# Patient Record
Sex: Male | Born: 1960 | Race: White | Hispanic: No | Marital: Married | Smoking: Current every day smoker
Health system: Southern US, Community
[De-identification: ages and names within clinical notes are randomized; demographics above are authoritative.]

## PROBLEM LIST (undated history)

## (undated) DIAGNOSIS — Z789 Other specified health status: Secondary | ICD-10-CM

---

## 2004-05-16 ENCOUNTER — Emergency Department (HOSPITAL_COMMUNITY): Admission: EM | Admit: 2004-05-16 | Discharge: 2004-05-16 | Payer: Self-pay | Admitting: Emergency Medicine

## 2005-06-26 IMAGING — CR DG FOOT COMPLETE 3+V*L*
3 series · 3 of 3 positions shown · non-contrast
Comparison: none

CLINICAL DATA: Fall.  Left foot trauma.  Pain and swelling.  
 LEFT FOOT  - THREE VIEW:
 A minimally displaced fracture is seen involving the fifth metatarsal head.  Associated soft tissue swelling is seen.  No other fractures are seen and there is no evidence of dislocation.

[view not recorded (1 of 3)]
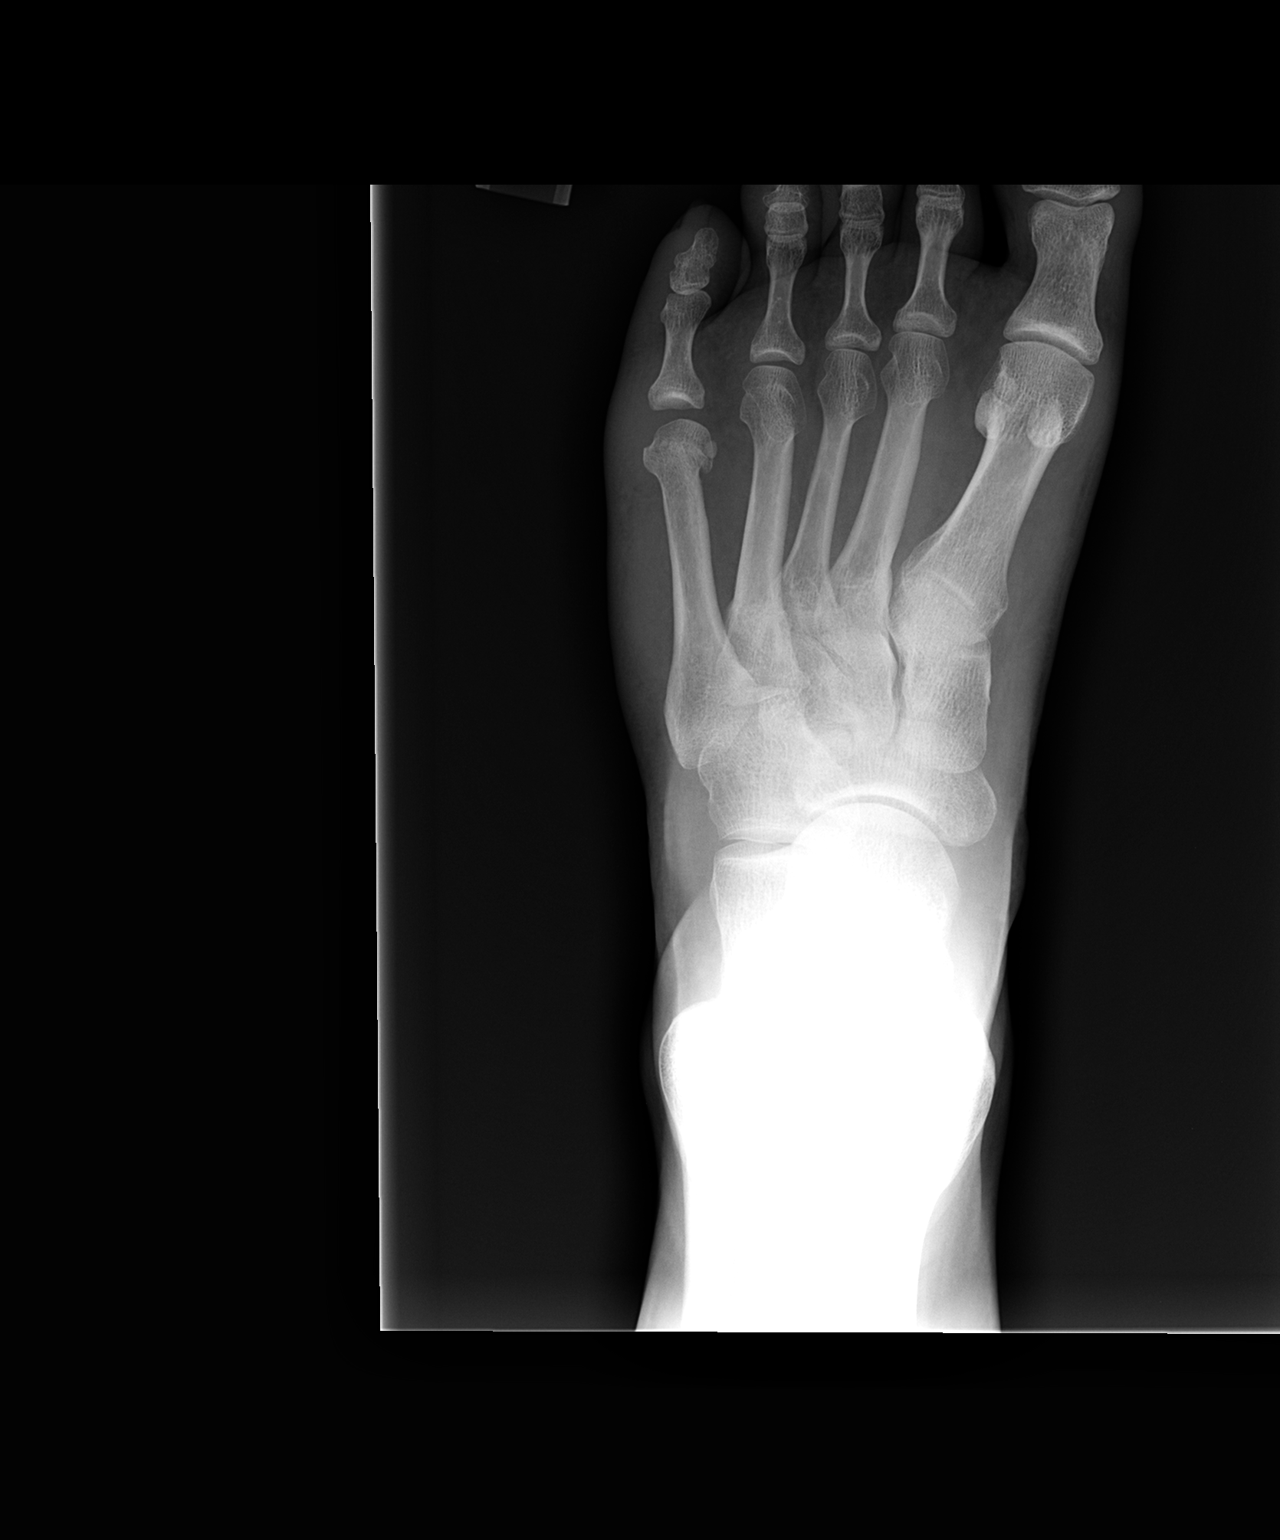

[view not recorded (2 of 3)]
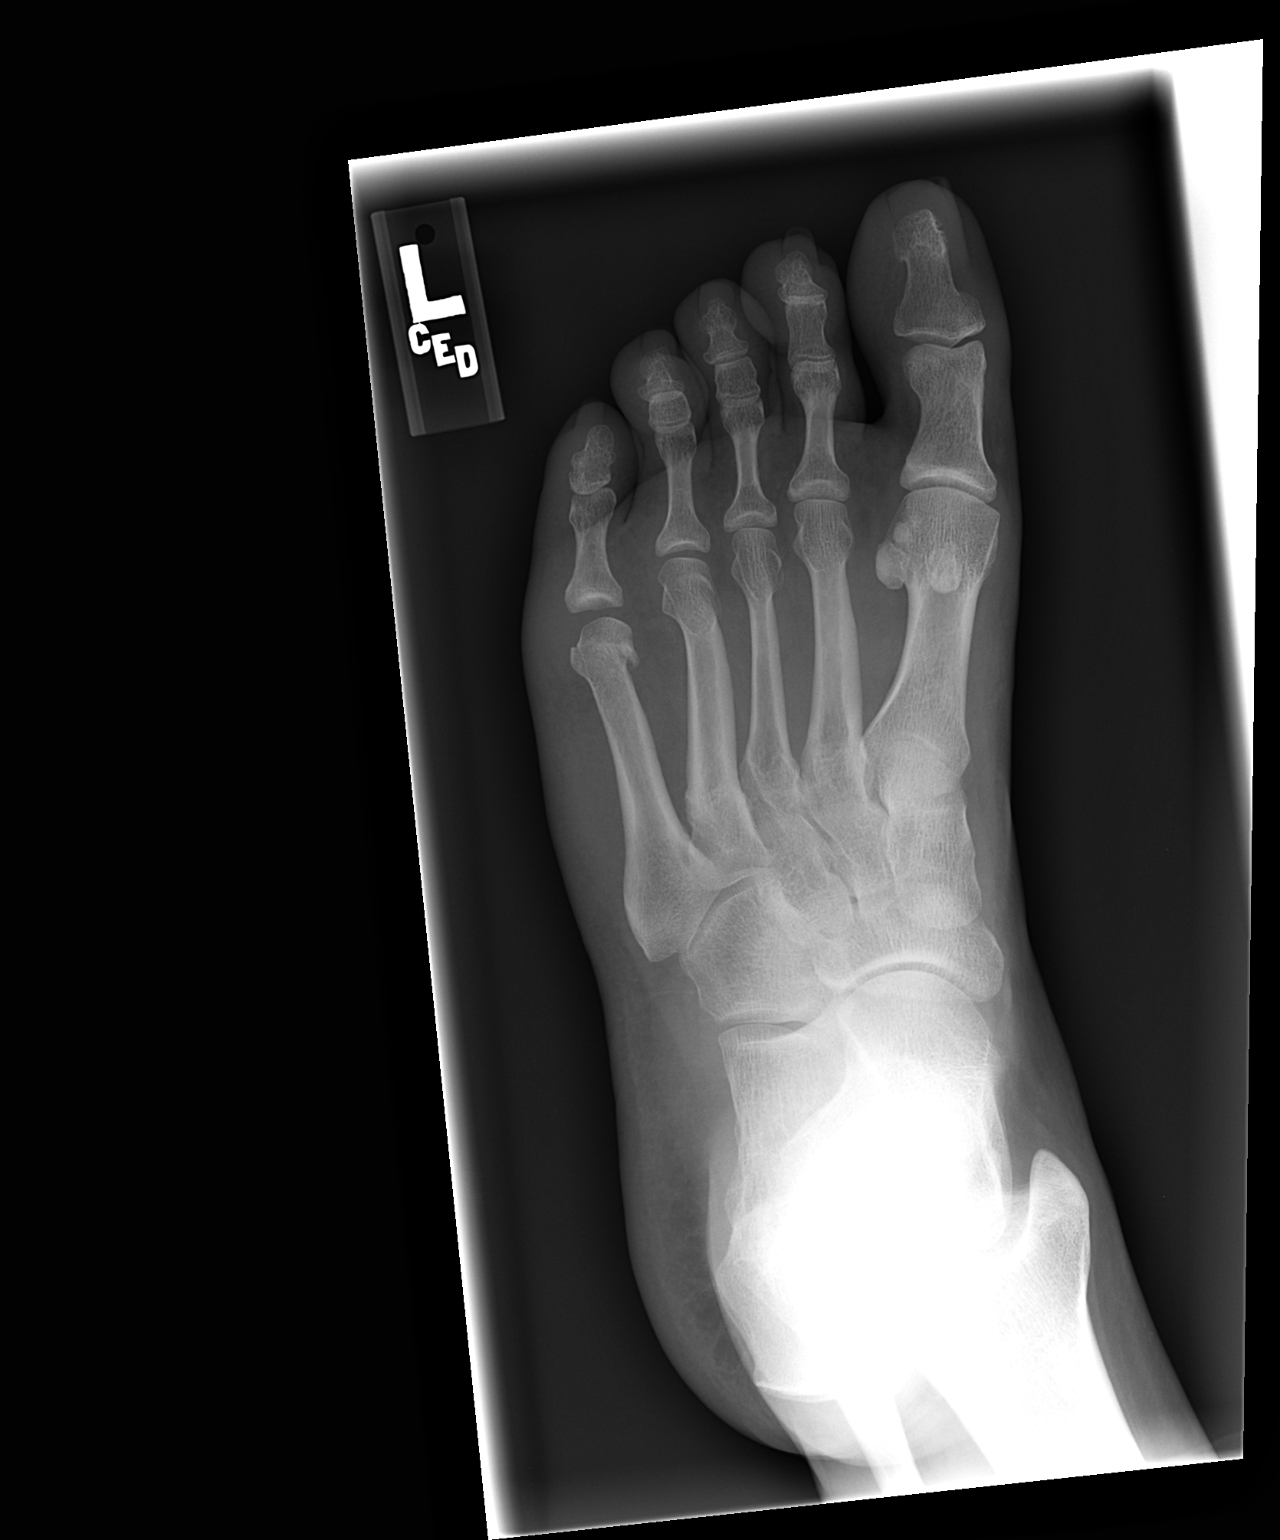

[view not recorded (3 of 3)]
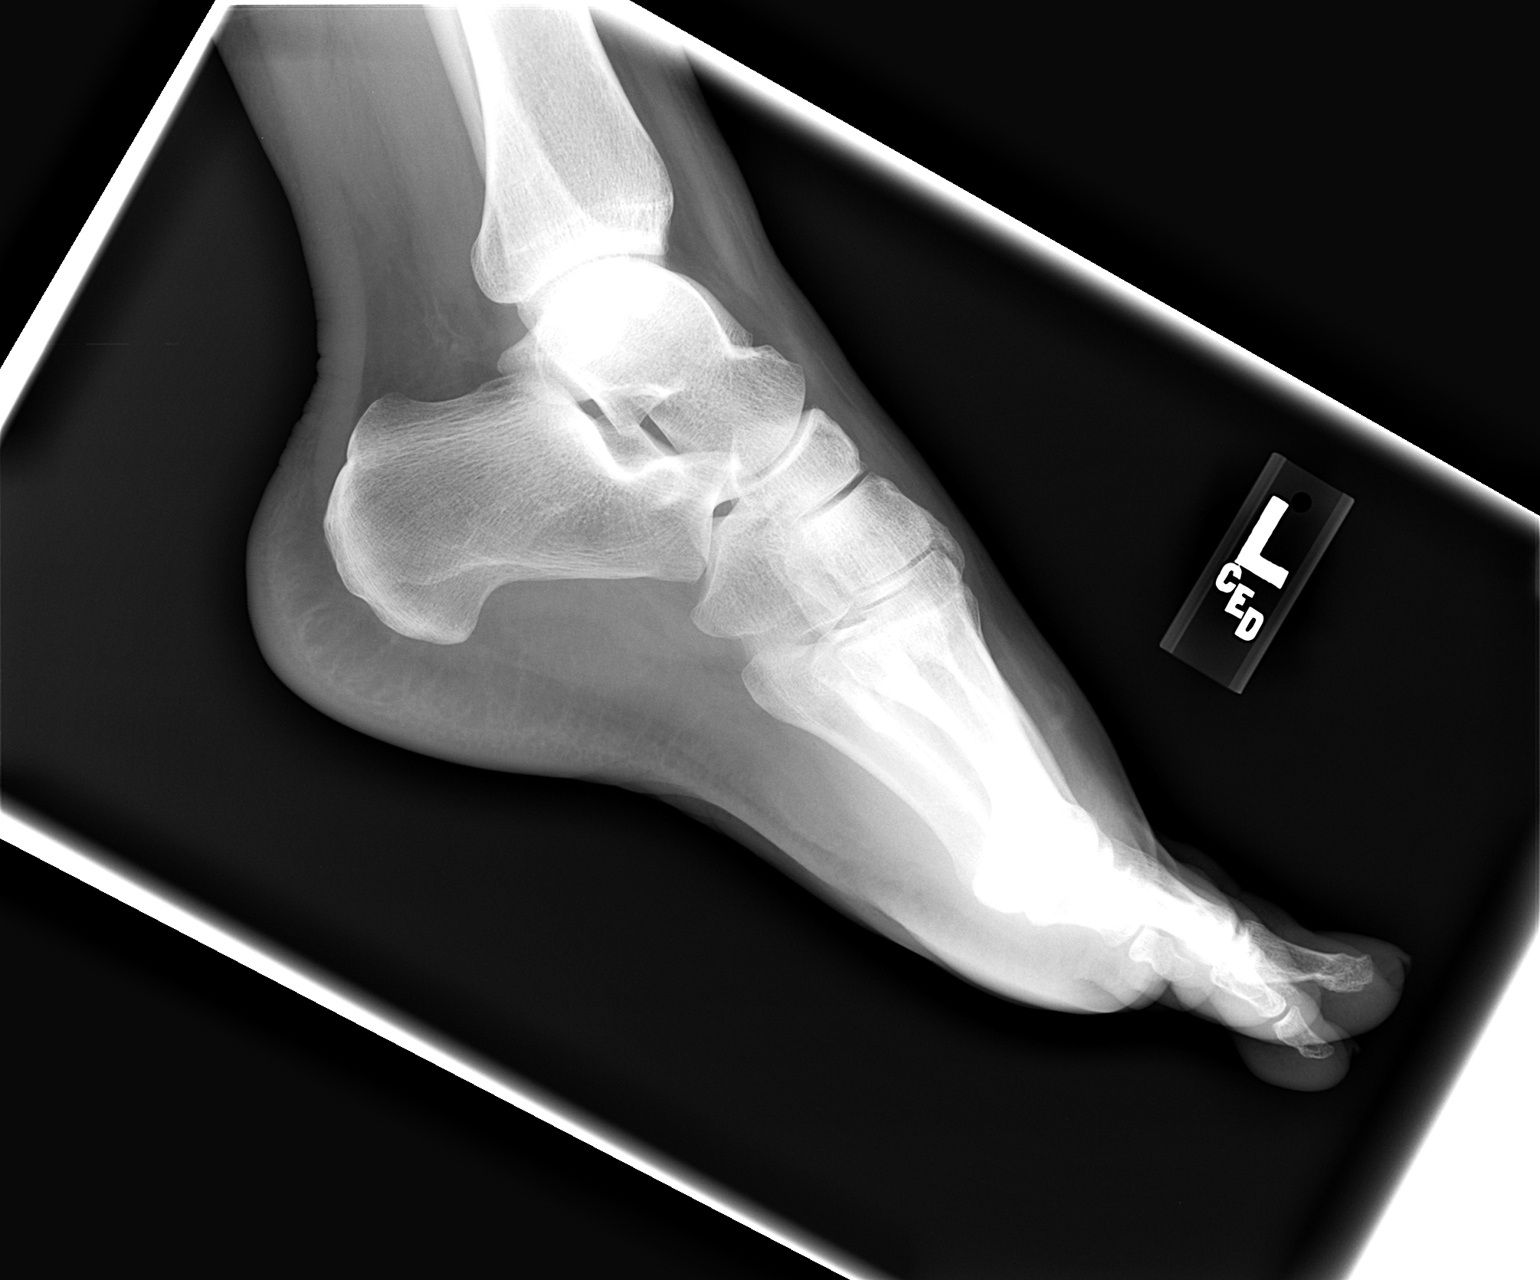

[3 of 3 positions shown; findings below may reference images not displayed]

IMPRESSION: Minimally displaced fracture of the distal fifth metatarsal.

## 2022-08-09 ENCOUNTER — Other Ambulatory Visit: Payer: Self-pay

## 2022-08-09 ENCOUNTER — Emergency Department (HOSPITAL_COMMUNITY): Payer: No Typology Code available for payment source

## 2022-08-09 ENCOUNTER — Emergency Department (HOSPITAL_COMMUNITY)
Admission: EM | Admit: 2022-08-09 | Discharge: 2022-08-09 | Disposition: A | Discharge status: Home / Self Care | Payer: No Typology Code available for payment source | Attending: Emergency Medicine | Admitting: Emergency Medicine

## 2022-08-09 ENCOUNTER — Encounter (HOSPITAL_COMMUNITY): Payer: Self-pay

## 2022-08-09 DIAGNOSIS — R1031 Right lower quadrant pain: Secondary | ICD-10-CM | POA: Diagnosis not present

## 2022-08-09 DIAGNOSIS — G8918 Other acute postprocedural pain: Secondary | ICD-10-CM | POA: Diagnosis not present

## 2022-08-09 DIAGNOSIS — R1011 Right upper quadrant pain: Secondary | ICD-10-CM | POA: Insufficient documentation

## 2022-08-09 HISTORY — DX: Other specified health status: Z78.9

## 2022-08-09 LAB — COMPREHENSIVE METABOLIC PANEL
ALT: 19 U/L (ref 0–44)
AST: 18 U/L (ref 15–41)
Albumin: 3.8 g/dL (ref 3.5–5.0)
Alkaline Phosphatase: 71 U/L (ref 38–126)
Anion gap: 9 (ref 5–15)
BUN: 8 mg/dL (ref 8–23)
CO2: 23 mmol/L (ref 22–32)
Calcium: 9.2 mg/dL (ref 8.9–10.3)
Chloride: 103 mmol/L (ref 98–111)
Creatinine, Ser: 0.99 mg/dL (ref 0.61–1.24)
GFR, Estimated: >60 mL/min (ref >60)
Glucose, Bld: 90 mg/dL (ref 70–99)
Potassium: 3.6 mmol/L (ref 3.5–5.1)
Sodium: 135 mmol/L (ref 135–145)
Total Bilirubin: 0.9 mg/dL (ref 0.3–1.2)
Total Protein: 7.1 g/dL (ref 6.5–8.1)

## 2022-08-09 LAB — URINALYSIS, ROUTINE W REFLEX MICROSCOPIC
Bilirubin Urine: NEGATIVE
Glucose, UA: NEGATIVE mg/dL
Ketones, ur: NEGATIVE mg/dL
Leukocytes,Ua: NEGATIVE
Nitrite: NEGATIVE
Protein, ur: NEGATIVE mg/dL
Specific Gravity, Urine: 1.005 (ref 1.005–1.030)
pH: 6 (ref 5.0–8.0)

## 2022-08-09 LAB — CBC WITH DIFFERENTIAL/PLATELET
Abs Immature Granulocytes: 0.04 10*3/uL (ref 0.00–0.07)
Basophils Absolute: 0.1 10*3/uL (ref 0.0–0.1)
Basophils Relative: 0 %
Eosinophils Absolute: 0.2 10*3/uL (ref 0.0–0.5)
Eosinophils Relative: 1 %
HCT: 45 % (ref 39.0–52.0)
Hemoglobin: 15.2 g/dL (ref 13.0–17.0)
Immature Granulocytes: 0 %
Lymphocytes Relative: 26 %
Lymphs Abs: 3.6 10*3/uL (ref 0.7–4.0)
MCH: 31.5 pg (ref 26.0–34.0)
MCHC: 33.8 g/dL (ref 30.0–36.0)
MCV: 93.4 fL (ref 80.0–100.0)
Monocytes Absolute: 1.3 10*3/uL — ABNORMAL HIGH (ref 0.1–1.0)
Monocytes Relative: 9 %
Neutro Abs: 8.6 10*3/uL — ABNORMAL HIGH (ref 1.7–7.7)
Neutrophils Relative %: 64 %
Platelets: 247 10*3/uL (ref 150–400)
RBC: 4.82 MIL/uL (ref 4.22–5.81)
RDW: 13.2 % (ref 11.5–15.5)
WBC: 13.7 10*3/uL — ABNORMAL HIGH (ref 4.0–10.5)
nRBC: 0 % (ref 0.0–0.2)

## 2022-08-09 LAB — LIPASE, BLOOD: Lipase: 33 U/L (ref 11–51)

## 2022-08-09 MED ORDER — IOHEXOL 350 MG/ML SOLN
75.0000 mL | Freq: Once | INTRAVENOUS | Status: AC | PRN
  Administered 2022-08-09: 75 mL via INTRAVENOUS

## 2022-08-09 MED ORDER — AMOXICILLIN-POT CLAVULANATE 875-125 MG PO TABS: 1.0000 | ORAL_TABLET | Freq: Two times a day (BID) | ORAL | 0 refills | Status: AC

## 2022-08-09 MED ORDER — MORPHINE SULFATE (PF) 4 MG/ML IV SOLN: 4.0000 mg | INTRAVENOUS | Status: DC | PRN

## 2022-08-09 NOTE — ED Triage Notes (Signed)
Pt reports colonoscopy at the [facility] yesterday and then today has developed right sided abdominal pain at rest, which worsens to pressure across entire abdomen with movement. [facility]. Denies n/v/d.

## 2022-08-09 NOTE — ED Provider Notes (Signed)
[place] EMERGENCY DEPARTMENT AT [facility] Provider Note   CSN: [id number] Arrival date & time: 08/09/22  1334     History  Chief Complaint  Patient presents with   Abdominal Pain    Shawn Gibbs is a 62 y.o. male.  HPI     62 year old patient comes in with chief complaint of abdominal pain.  Patient indicates that he had a colonoscopy yesterday, and multiple polyps were removed.  After being discharged, he had dull right-sided abdominal pain.  This morning however the pain has migrated towards the left side as well.  The pain is more constant, primarily present when he is moving and has worsened as the day has gone on.  He also feels bloated.  He called the GI doctor who advised that he come to the emergency room.  Review of system is negative for fevers, chills, nausea, vomiting.  Home Medications Prior to Admission medications   Medication Sig Start Date End Date Taking? Authorizing Provider  amoxicillin-clavulanate (AUGMENTIN) 875-125 MG tablet Take 1 tablet by mouth every 12 (twelve) hours. 08/09/22  Yes Derwood Kaplan, MD      Allergies    Metformin, Diflunisal, and Varenicline tartrate    Review of Systems   Review of Systems  All other systems reviewed and are negative.   Physical Exam Updated Vital Signs BP 138/71 (BP Location: Right Arm)   Pulse 75   Temp 98.4 F (36.9 C)   Resp 20   Ht  (1.676 m)   Wt 80.7 kg   SpO2 98%   BMI 28.73 kg/m  Physical Exam Vitals and nursing note reviewed.  Constitutional:      Appearance: He is well-developed.  HENT:     Head: Atraumatic.  Cardiovascular:     Rate and Rhythm: Normal rate.  Pulmonary:     Effort: Pulmonary effort is normal.  Abdominal:     Tenderness: There is abdominal tenderness in the right upper quadrant and right lower quadrant. There is guarding. There is no rebound.  Musculoskeletal:     Cervical back: Neck supple.  Skin:    General: Skin is warm.  Neurological:      Mental Status: He is alert and oriented to person, place, and time.     ED Results / Procedures / Treatments   Labs (all labs ordered are listed, but only abnormal results are displayed) Labs Reviewed  CBC WITH DIFFERENTIAL/PLATELET - Abnormal; Notable for the following components:      Result Value   WBC 13.7 (*)    Neutro Abs 8.6 (*)    Monocytes Absolute 1.3 (*)    All other components within normal limits  URINALYSIS, ROUTINE W REFLEX MICROSCOPIC - Abnormal; Notable for the following components:   Hgb urine dipstick MODERATE (*)    Bacteria, UA RARE (*)    All other components within normal limits  COMPREHENSIVE METABOLIC PANEL  LIPASE, BLOOD    EKG None  Radiology CT Abdomen Pelvis W Contrast  Result Date: 08/09/2022 CLINICAL DATA:  Right abdominal pain.  Colonoscopy yesterday. EXAM: CT ABDOMEN AND PELVIS WITH CONTRAST TECHNIQUE: Multidetector CT imaging of the abdomen and pelvis was performed using the standard protocol following bolus administration of intravenous contrast. RADIATION DOSE REDUCTION: This exam was performed according to the departmental dose-optimization program which includes automated exposure control, adjustment of the mA and/or kV according to patient size and/or use of iterative reconstruction technique. CONTRAST:  75mL OMNIPAQUE IOHEXOL 350 MG/ML SOLN COMPARISON:  None Available. FINDINGS: Lower chest: Linear subsegmental atelectasis or scarring noted at both lung bases. Hepatobiliary: Contracted gallbladder.  Otherwise unremarkable. Pancreas: Unremarkable Spleen: Unremarkable Adrenals/Urinary Tract: Unremarkable Stomach/Bowel: 0.5 cm in length clip noted in the rectum, image 79 series 6. Sigmoid colon diverticulosis without findings of active diverticulitis. Two adjacent clips are noted in the cecum on image 46 series 3 potentially with mild adjacent cecal wall thickening and some trace stranding in the adjacent right paracolic gutter, but no overt ascites  or extraluminal gas. No local abscess. A thin local tubular structure favoring appendix appears normal on image 41 series 7. The terminal ileum appears normal. Vascular/Lymphatic: Atherosclerosis is present, including aortoiliac atherosclerotic disease. No pathologic adenopathy. Reproductive: Unremarkable Other: No supplemental non-categorized findings. Musculoskeletal: Chronic bilateral pars defects at L5 with 6 mm anterolisthesis of L5 on S1 and resulting moderate bilateral foraminal stenosis at the L5-S1 level. IMPRESSION: 1. Two adjacent clips in the cecum, with mild adjacent cecal wall thickening and some trace stranding in the adjacent right paracolic gutter, but no overt ascites or extraluminal gas. The appendix appears normal. 2. 0.5 cm in length clip in the rectum. 3. Sigmoid colon diverticulosis without findings of active diverticulitis. 4. Chronic bilateral pars defects at L5 with 6 mm anterolisthesis of L5 on S1 and resulting moderate bilateral foraminal stenosis at L5-S1. 5. Aortic atherosclerosis. Aortic Atherosclerosis (ICD10-I70.0). Electronically Signed   By: Gaylyn Rong M.D.   On: 08/09/2022 20:01    Procedures Procedures    Medications Ordered in ED Medications  morphine (PF) 4 MG/ML injection 4 mg (has no administration in time range)  iohexol (OMNIPAQUE) 350 MG/ML injection 75 mL (75 mLs Intravenous Contrast Given 08/09/22 1925)    ED Course/ Medical Decision Making/ A&P                             Medical Decision Making Risk Prescription drug management.  This patient presents to the ED with chief complaint(s) of abdominal pain, right-sided that has worsened over time with pertinent past medical history of colonoscopy that was completed yesterday.The complaint involves an extensive differential diagnosis and also carries with it a high risk of complications and morbidity.    The differential diagnosis includes : Bloating from colonoscopy, ileus,  microperforation/perforated viscus, internal bleeding, pain secondary to the colonoscopy itself.  The initial plan is to get basic labs and CT scan.  Additional history obtained: Additional history obtained from spouse Records reviewed Care Everywhere/External Records.  Reviewed records of the colonoscopy.  Independent labs interpretation:  The following labs were independently interpreted: Slightly elevated white count.  Independent visualization and interpretation of imaging: - I independently visualized the following imaging with scope of interpretation limited to determining acute life threatening conditions related to emergency care:CT abdomen/pelvis, which revealed that there is no perforation.  Treatment and Reassessment: Results discussed with the patient.  Will give him Augmentin prescription, he will only take that if he starts having fevers or worsening pain.  The patient appears reasonably screened and/or stabilized for discharge and I doubt any other medical condition or other [facility] requiring further screening, evaluation, or treatment in the ED at this time prior to discharge.   Results from the ER workup discussed with the patient face to face and all questions answered to the best of my ability. The patient is safe for discharge with strict return precautions.    Final Clinical Impression(s) / ED Diagnoses Final diagnoses:  Post-op  pain  Right upper quadrant abdominal pain    Rx / DC Orders ED Discharge Orders          Ordered    amoxicillin-clavulanate (AUGMENTIN) 875-125 MG tablet  Every 12 hours        08/09/22 2045              Derwood Kaplan, MD 08/09/22 2056

## 2022-08-09 NOTE — Discharge Instructions (Signed)
You are seen in the emergency room for abdominal pain.  The CT scan reveals some inflammation around the cecum, where you had polyps removed.  Please take the antibiotics only if you start developing fevers.  The impression of the CT scan as discussed below, you might want to share that with the GI doctor.  IMPRESSION:  1. Two adjacent clips in the cecum, with mild adjacent cecal wall  thickening and some trace stranding in the adjacent right paracolic  gutter, but no overt ascites or extraluminal gas. The appendix  appears normal.  2. 0.5 cm in length clip in the rectum.  3. Sigmoid colon diverticulosis without findings of active  diverticulitis.  4. Chronic bilateral pars defects at L5 with 6 mm anterolisthesis of  L5 on S1 and resulting moderate bilateral foraminal stenosis at  L5-S1.  5. Aortic atherosclerosis.

## 2022-08-09 NOTE — ED Provider Triage Note (Signed)
Emergency Medicine Provider Triage Evaluation Note  Shawn Gibbs , a 62 y.o. male  was evaluated in triage.  Pt complains of right-sided abdominal pain following colonoscopy yesterday.  Reports he had a colonoscopy a month ago and the polyp was clipped and at colonoscopy yesterday this was removed.  No bowel movement since procedure.  Reports he is passing gas but this is minimal.  He denies associated nausea, vomiting, diarrhea, fever, chills, chest pain, or shortness of breath.  States that the pain is well-managed when he is at rest but worsens when he moves.  Other previous abdominal surgeries include a hernia repair as a child.  Review of Systems  Positive: See HPI Negative: See HPI  Physical Exam  BP (!) 145/91 (BP Location: Right Arm)   Pulse (!) 101   Temp 99.1 F (37.3 C)   Resp 18   Ht  (1.676 m)   Wt 80.7 kg   SpO2 93%   BMI 28.73 kg/m  Gen:   Awake, no distress   Resp:  Normal effort  MSK:   Moves extremities without difficulty  Other:  Moderate diffuse right-sided abdominal tenderness, slight abdominal distention, no rebound or guarding, no CVA tenderness; decreased bowel sounds throughout, mild tachycardia with regular rhythm  Medical Decision Making  Medically screening exam initiated at 2:07 PM.  Appropriate orders placed.  [name] was informed that the remainder of the evaluation will be completed by another provider, this initial triage assessment does not replace that evaluation, and the importance of remaining in the ED until their evaluation is complete.     Tonette Lederer, PA-C 08/09/22 1409
